# Patient Record
Sex: Male | Born: 2000 | Race: White | Hispanic: No | Marital: Single | Smoking: Never smoker
Health system: Southern US, Community
[De-identification: ages and names within clinical notes are randomized; demographics above are authoritative.]

---

## 2019-03-26 ENCOUNTER — Other Ambulatory Visit: Payer: Self-pay

## 2019-03-26 DIAGNOSIS — Z20822 Contact with and (suspected) exposure to covid-19: Secondary | ICD-10-CM

## 2019-03-27 LAB — NOVEL CORONAVIRUS, NAA: SARS-CoV-2, NAA: NOT DETECTED

## 2019-04-01 ENCOUNTER — Other Ambulatory Visit: Payer: Self-pay

## 2019-04-01 DIAGNOSIS — Z20822 Contact with and (suspected) exposure to covid-19: Secondary | ICD-10-CM

## 2019-04-02 LAB — NOVEL CORONAVIRUS, NAA: SARS-CoV-2, NAA: NOT DETECTED

## 2019-05-12 ENCOUNTER — Other Ambulatory Visit: Payer: Self-pay

## 2019-05-12 DIAGNOSIS — Z20822 Contact with and (suspected) exposure to covid-19: Secondary | ICD-10-CM

## 2019-05-12 DIAGNOSIS — Z20828 Contact with and (suspected) exposure to other viral communicable diseases: Secondary | ICD-10-CM

## 2019-05-13 LAB — NOVEL CORONAVIRUS, NAA: SARS-CoV-2, NAA: NOT DETECTED

## 2019-05-22 ENCOUNTER — Emergency Department: Payer: 59

## 2019-05-22 ENCOUNTER — Encounter: Payer: Self-pay | Admitting: Emergency Medicine

## 2019-05-22 ENCOUNTER — Other Ambulatory Visit: Payer: Self-pay

## 2019-05-22 ENCOUNTER — Emergency Department
Admission: EM | Admit: 2019-05-22 | Discharge: 2019-05-22 | Disposition: A | Discharge status: Home / Self Care | Payer: 59 | Attending: Emergency Medicine | Admitting: Emergency Medicine

## 2019-05-22 DIAGNOSIS — I861 Scrotal varices: Secondary | ICD-10-CM | POA: Insufficient documentation

## 2019-05-22 DIAGNOSIS — R52 Pain, unspecified: Secondary | ICD-10-CM

## 2019-05-22 DIAGNOSIS — N50812 Left testicular pain: Secondary | ICD-10-CM | POA: Diagnosis present

## 2019-05-22 NOTE — ED Triage Notes (Signed)
Pt arrives with complaints of left testicle pain from "bump." Pt reports "you can't really see if but you can feel it." Pt reports he first noticed it 3 weeks prior. Pt denies increased in size but reports he contacted his PCP who suggested pt come in for [place]. Pt reports "less than mild pain more of a discomfort."

## 2019-05-22 NOTE — ED Provider Notes (Signed)
[facility] Emergency Department Provider Note  ____________________________________________  Time seen: Approximately 3:45 PM  I have reviewed the triage vital signs and the nursing notes.   HISTORY  Chief Complaint Testicle Pain   HPI Shawn Beard is a 18 y.o. male no significant past medical history who presents for evaluation of left testicular mass.  Patient reports that he noticed it 3 weeks ago.  Has not changed size.  Denies any pain or trauma.  Denies any family history of testicular cancer. He noticed a small palpable "bump" in the superior  aspect of the L testicle. No abdominal pain, no fever, no weight loss, no penile discharge.  PMH None - reviewed  Allergies Patient has no allergy information on record.  FH No history of testicular cancer  Social History Smoking - no Drugs - no Alcohol - no  Review of Systems  Constitutional: Negative for fever. Eyes: Negative for visual changes. ENT: Negative for sore throat. Neck: No neck pain  Cardiovascular: Negative for chest pain. Respiratory: Negative for shortness of breath. Gastrointestinal: Negative for abdominal pain, vomiting or diarrhea. Genitourinary: Negative for dysuria. + L testicular mass Musculoskeletal: Negative for back pain. Skin: Negative for rash. Neurological: Negative for headaches, weakness or numbness. Psych: No SI or HI  ____________________________________________   PHYSICAL EXAM:  VITAL SIGNS: ED Triage Vitals [05/22/19 1350]  Enc Vitals Group     BP (!) 154/75     Pulse Rate 76     Resp 17     Temp 98.2 F (36.8 C)     Temp src      SpO2 98 %     Weight 175 lb (79.4 kg)     Height 6\' 3"  (1.905 m)     Head Circumference      Peak Flow      Pain Score 4     Pain Loc      Pain Edu?      Excl. in [place]?     Constitutional: Alert and oriented. Well appearing and in no apparent distress. HEENT:      Head: Normocephalic and atraumatic.         Eyes: Conjunctivae are normal. Sclera is non-icteric.       Mouth/Throat: Mucous membranes are moist.       Neck: Supple with no signs of meningismus. Cardiovascular: Regular rate and rhythm.  Respiratory: Normal respiratory effort.  Gastrointestinal: Soft, non tender, and non distended with positive bowel sounds. No rebound or guarding. GU: Bilateral testicles are descended with no tenderness to palpation, bilateral positive cremasteric reflexes are present, no swelling or erythema of the scrotum. No masses palpable. The area of concern seems to be the epididymis.  No evidence of inguinal hernia. Musculoskeletal: Nontender with normal range of motion in all extremities. No edema, cyanosis, or erythema of extremities. Neurologic: Normal speech and language. Face is symmetric. Moving all extremities. No gross focal neurologic deficits are appreciated. Skin: Skin is warm, dry and intact. No rash noted. Psychiatric: Mood and affect are normal. Speech and behavior are normal.  ____________________________________________   LABS (all labs ordered are listed, but only abnormal results are displayed)  Labs Reviewed - No data to display ____________________________________________  EKG  none  ____________________________________________  RADIOLOGY  I have personally reviewed the images performed during this visit and I agree with the Radiologist's read.   Interpretation by Radiologist:  US Scrotum W/doppler  Result Date: 05/22/2019 CLINICAL DATA:  Left testicular area of  palpable concern and pain. EXAM: SCROTAL ULTRASOUND DOPPLER ULTRASOUND OF THE TESTICLES TECHNIQUE: Complete ultrasound examination of the testicles, epididymis, and other scrotal structures was performed. Color and spectral Doppler ultrasound were also utilized to evaluate blood flow to the testicles. COMPARISON:  None. FINDINGS: Right testicle Measurements: 5.0 x 2.8 x 2.4 cm. No mass or microlithiasis visualized. Left  testicle Measurements: 0.5 x 3.1 x 2.5 cm. No mass or microlithiasis visualized. Right epididymis:  Normal in size and appearance. Left epididymis:  Normal in size and appearance. Hydrocele:  None visualized. Varicocele:  Mild left varicocele. Pulsed Doppler interrogation of both testes demonstrates normal low resistance arterial and venous waveforms bilaterally. IMPRESSION: Normal appearance of the testicles. Mild left varicocele. Electronically Signed   By: Ted Mcalpine M.D.   On: 05/22/2019 14:44     ____________________________________________   PROCEDURES  Procedure(s) performed: None Procedures Critical Care performed:  None ____________________________________________   INITIAL IMPRESSION / ASSESSMENT AND PLAN / ED COURSE  18 y.o. male no significant past medical history who presents for evaluation of left testicular mass.  On exam he points to the superior part of his left testicle where the epididymis lies.  I do not appreciate a mass on palpation just more pronounced epididymis.  Ultrasound was done which shows a mild left varicocele but no mass or torsion.  Patient requested a referral to urology for a second opinion which was provided to him.  Discussed my standard return precautions and follow-up with PCP.       As part of my medical decision making, I reviewed the following data within the electronic MEDICAL RECORD NUMBER Nursing notes reviewed and incorporated, Old chart reviewed, Radiograph reviewed , Notes from prior ED visits and [place] Controlled Substance Database   Patient was evaluated in Emergency Department today for the symptoms described in the history of present illness. Patient was evaluated in the context of the global COVID-19 pandemic, which necessitated consideration that the patient might be at risk for infection with the SARS-CoV-2 virus that causes COVID-19. Institutional protocols and algorithms that pertain to the evaluation of patients at risk for COVID-19  are in a state of rapid change based on information released by regulatory bodies including the CDC and federal and state organizations. These policies and algorithms were followed during the patient's care in the ED.   ____________________________________________   FINAL CLINICAL IMPRESSION(S) / ED DIAGNOSES   Final diagnoses:  Varicocele      NEW MEDICATIONS STARTED DURING THIS VISIT:  ED Discharge Orders    None       Note:  This document was prepared using Dragon voice recognition software and may include unintentional dictation errors.    Don Perking, [place], MD 05/22/19 [phone]

## 2019-05-22 NOTE — ED Notes (Signed)
This RN present with MD during scrotum exam. No redness or swelling noted.

## 2019-05-22 NOTE — ED Notes (Signed)
Pt c/o bump and discomfort "when I think about it" in scrotum. Will wait for MD for assessment.

## 2019-06-09 ENCOUNTER — Other Ambulatory Visit: Payer: Self-pay | Admitting: *Deleted

## 2019-06-09 DIAGNOSIS — Z20822 Contact with and (suspected) exposure to covid-19: Secondary | ICD-10-CM

## 2019-06-10 LAB — NOVEL CORONAVIRUS, NAA: SARS-CoV-2, NAA: NOT DETECTED

## 2019-06-26 ENCOUNTER — Other Ambulatory Visit: Payer: Self-pay

## 2019-06-26 ENCOUNTER — Emergency Department
Admission: EM | Admit: 2019-06-26 | Discharge: 2019-06-26 | Disposition: A | Discharge status: Home / Self Care | Payer: 59 | Attending: Emergency Medicine | Admitting: Emergency Medicine

## 2019-06-26 ENCOUNTER — Emergency Department: Payer: 59

## 2019-06-26 DIAGNOSIS — R5383 Other fatigue: Secondary | ICD-10-CM | POA: Diagnosis not present

## 2019-06-26 DIAGNOSIS — Z20828 Contact with and (suspected) exposure to other viral communicable diseases: Secondary | ICD-10-CM | POA: Diagnosis not present

## 2019-06-26 DIAGNOSIS — Z20822 Contact with and (suspected) exposure to covid-19: Secondary | ICD-10-CM

## 2019-06-26 LAB — CBC WITH DIFFERENTIAL/PLATELET
Abs Immature Granulocytes: 0.01 10*3/uL (ref 0.00–0.07)
Basophils Absolute: 0 10*3/uL (ref 0.0–0.1)
Basophils Relative: 1 %
Eosinophils Absolute: 0.1 10*3/uL (ref 0.0–0.5)
Eosinophils Relative: 1 %
HCT: 40.6 % (ref 39.0–52.0)
Hemoglobin: 14.2 g/dL (ref 13.0–17.0)
Immature Granulocytes: 0 %
Lymphocytes Relative: 30 %
Lymphs Abs: 1.8 10*3/uL (ref 0.7–4.0)
MCH: 29.6 pg (ref 26.0–34.0)
MCHC: 35 g/dL (ref 30.0–36.0)
MCV: 84.8 fL (ref 80.0–100.0)
Monocytes Absolute: 0.6 10*3/uL (ref 0.1–1.0)
Monocytes Relative: 10 %
Neutro Abs: 3.5 10*3/uL (ref 1.7–7.7)
Neutrophils Relative %: 58 %
Platelets: 183 10*3/uL (ref 150–400)
RBC: 4.79 MIL/uL (ref 4.22–5.81)
RDW: 11.8 % (ref 11.5–15.5)
WBC: 6 10*3/uL (ref 4.0–10.5)
nRBC: 0 % (ref 0.0–0.2)

## 2019-06-26 LAB — BASIC METABOLIC PANEL
Anion gap: 11 (ref 5–15)
BUN: 18 mg/dL (ref 6–20)
CO2: 25 mmol/L (ref 22–32)
Calcium: 9.7 mg/dL (ref 8.9–10.3)
Chloride: 103 mmol/L (ref 98–111)
Creatinine, Ser: 0.79 mg/dL (ref 0.61–1.24)
GFR calc Af Amer: >60 mL/min (ref >60)
GFR calc non Af Amer: >60 mL/min (ref >60)
Glucose, Bld: 88 mg/dL (ref 70–99)
Potassium: 3.8 mmol/L (ref 3.5–5.1)
Sodium: 139 mmol/L (ref 135–145)

## 2019-06-26 LAB — SARS CORONAVIRUS 2 (TAT 6-24 HRS): SARS Coronavirus 2: NEGATIVE

## 2019-06-26 LAB — MONONUCLEOSIS SCREEN: Mono Screen: NEGATIVE

## 2019-06-26 MED ORDER — AZITHROMYCIN 250 MG PO TABS: ORAL_TABLET | ORAL | 0 refills | Status: AC

## 2019-06-26 MED ORDER — PREDNISONE 10 MG (21) PO TBPK: ORAL_TABLET | ORAL | 0 refills | Status: AC

## 2019-06-26 NOTE — ED Triage Notes (Signed)
Pt states he was under quaratine for covid from the 11/7-11/17 and was getting better but today having fatigue and just feeling tired.

## 2019-06-26 NOTE — ED Notes (Signed)
See triage note- pt tested positive for covid and quarantined for 10 days, but still feels chest tightness and fatigue- primary care dr told him to get "checked out"

## 2019-06-26 NOTE — ED Provider Notes (Signed)
[facility] Emergency Department Provider Note  ____________________________________________   First MD Initiated Contact with Patient 06/26/19 1228     (approximate)  I have reviewed the triage vital signs and the nursing notes.   HISTORY  Chief Complaint Fatigue    HPI Shawn Beard is a 18 y.o. male presents emergency department with concerns of Covid and continued fatigue.  Patient states he was tested on 06/09/2019 and was negative.  However his girlfriend tested positive and they quarantined together.  Now he is complaining of more fatigue and chest tightness.  His primary care doctor told him to come to the ED.  He is concerned as he is supposed to go home to [facility].  He denies chest pain or shortness of breath at this time.    History reviewed. No pertinent past medical history.  There are no active problems to display for this patient.   History reviewed. No pertinent surgical history.  Prior to Admission medications   Medication Sig Start Date End Date Taking? Authorizing Provider  azithromycin (ZITHROMAX Z-PAK) 250 MG tablet 2 pills today then 1 pill a day for 4 days 06/26/19   Caryn Section, Linden Dolin, PA-C  predniSONE (STERAPRED UNI-PAK 21 TAB) 10 MG (21) TBPK tablet Take 6 pills on day one then decrease by 1 pill each day 06/26/19   Versie Starks, PA-C    Allergies Patient has no known allergies.  No family history on file.  Social History Social History   Tobacco Use  . Smoking status: Never Smoker  . Smokeless tobacco: Never Used  Substance Use Topics  . Alcohol use: Yes  . Drug use: Not Currently    Review of Systems  Constitutional: No fever/chills, positive fatigue Eyes: No visual changes. ENT: No sore throat. Respiratory: Positive cough Genitourinary: Negative for dysuria. Musculoskeletal: Negative for back pain. Skin: Negative for rash.    ____________________________________________   PHYSICAL EXAM:   VITAL SIGNS: ED Triage Vitals  Enc Vitals Group     BP 06/26/19 1144 140/72     Pulse Rate 06/26/19 1144 72     Resp 06/26/19 1144 16     Temp 06/26/19 1144 98.5 F (36.9 C)     Temp Source 06/26/19 1144 Oral     SpO2 06/26/19 1144 99 %     Weight 06/26/19 1146 175 lb (79.4 kg)     Height 06/26/19 1146 6' 3"  (1.905 m)     Head Circumference --      Peak Flow --      Pain Score 06/26/19 1145 0     Pain Loc --      Pain Edu? --      Excl. in [place]? --     Constitutional: Alert and oriented. Well appearing and in no acute distress. Eyes: Conjunctivae are normal.  Head: Atraumatic. Nose: No congestion/rhinnorhea. Mouth/Throat: Mucous membranes are moist.   Neck:  supple no lymphadenopathy noted Cardiovascular: Normal rate, regular rhythm. Heart sounds are normal Respiratory: Normal respiratory effort.  No retractions, lungs c t a  GU: deferred Musculoskeletal: FROM all extremities, warm and well perfused Neurologic:  Normal speech and language.  Skin:  Skin is warm, dry and intact. No rash noted. Psychiatric: Mood and affect are normal. Speech and behavior are normal.  ____________________________________________   LABS (all labs ordered are listed, but only abnormal results are displayed)  Labs Reviewed  SARS CORONAVIRUS 2 (TAT 6-24 HRS)  CBC WITH DIFFERENTIAL/PLATELET  BASIC METABOLIC  PANEL  MONONUCLEOSIS SCREEN   ____________________________________________   ____________________________________________  RADIOLOGY  Chest x-ray shows questionable pneumonia, in my opinion it shows streaking typical of Covid  ____________________________________________   PROCEDURES  Procedure(s) performed: no  Procedures    ____________________________________________   INITIAL IMPRESSION / ASSESSMENT AND PLAN / ED COURSE  Pertinent labs & imaging results that were available during my care of the patient were reviewed by me and considered in my medical decision  making (see chart for details).   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  Physical exam shows patient to appear well.  Vitals are normal.  Chest x-ray shows questionable beginnings of pneumonia. CBC, met C, mono test are all negative  Explained all the results to the patient.  He was given a prescription for Z-Pak steroid pack and encouraged to take Mucinex.  We did a Covid swab.  Explained patient the results would not be back for 6 to 24 hours.  He is to follow-up with his regular doctor at home in [place] if not improving in 3 days.  Return emergency department worsening.  Explained to him I would feel much better if he continue to quarantine for another week while at home as he may not have been positive originally when his girlfriend turned positive but he may be turning positive now.  He states he understands and will comply.  He was discharged stable condition.    Cristal Howatt was evaluated in Emergency Department on 06/26/2019 for the symptoms described in the history of present illness. He was evaluated in the context of the global COVID-19 pandemic, which necessitated consideration that the patient might be at risk for infection with the SARS-CoV-2 virus that causes COVID-19. Institutional protocols and algorithms that pertain to the evaluation of patients at risk for COVID-19 are in a state of rapid change based on information released by regulatory bodies including the CDC and federal and state organizations. These policies and algorithms were followed during the patient's care in the ED.   As part of my medical decision making, I reviewed the following data within the [place] notes reviewed and incorporated, Labs reviewed CBC, met C, mono negative, Old chart reviewed, Radiograph reviewed chest x-ray questionable pneumonia, Notes from prior ED visits and [place] Controlled Substance Database   ____________________________________________   FINAL CLINICAL IMPRESSION(S) / ED DIAGNOSES  Final diagnoses:  Suspected COVID-19 virus infection      NEW MEDICATIONS STARTED DURING THIS VISIT:  Discharge Medication List as of 06/26/2019  1:55 PM    START taking these medications   Details  azithromycin (ZITHROMAX Z-PAK) 250 MG tablet 2 pills today then 1 pill a day for 4 days, Normal    predniSONE (STERAPRED UNI-PAK 21 TAB) 10 MG (21) TBPK tablet Take 6 pills on day one then decrease by 1 pill each day, Normal         Note:  This document was prepared using Dragon voice recognition software and may include unintentional dictation errors.    Versie Starks, PA-C 06/26/19 1439    Duffy Bruce, MD 06/26/19 2256

## 2019-06-26 NOTE — Discharge Instructions (Addendum)
Follow-up with your regular doctor if not improving in 3 to 4 days.  Use medication as prescribed.  Return emergency department worsening.  Use Mucinex to break up the mucous plugs.  Covid test will result in 6 to 24 hours.  A nurse will call you with the results.  If you have not heard from [place] in 48 hours please call the ER for your results.

## 2020-11-02 IMAGING — DX DG CHEST 1V PORT
2 series · 2 of 2 positions shown · non-contrast
Comparison: None.

CLINICAL DATA: Shortness of breath, cough, suspected COVID

EXAM:
PORTABLE CHEST 1 VIEW

[chest ap (1 of 2)]
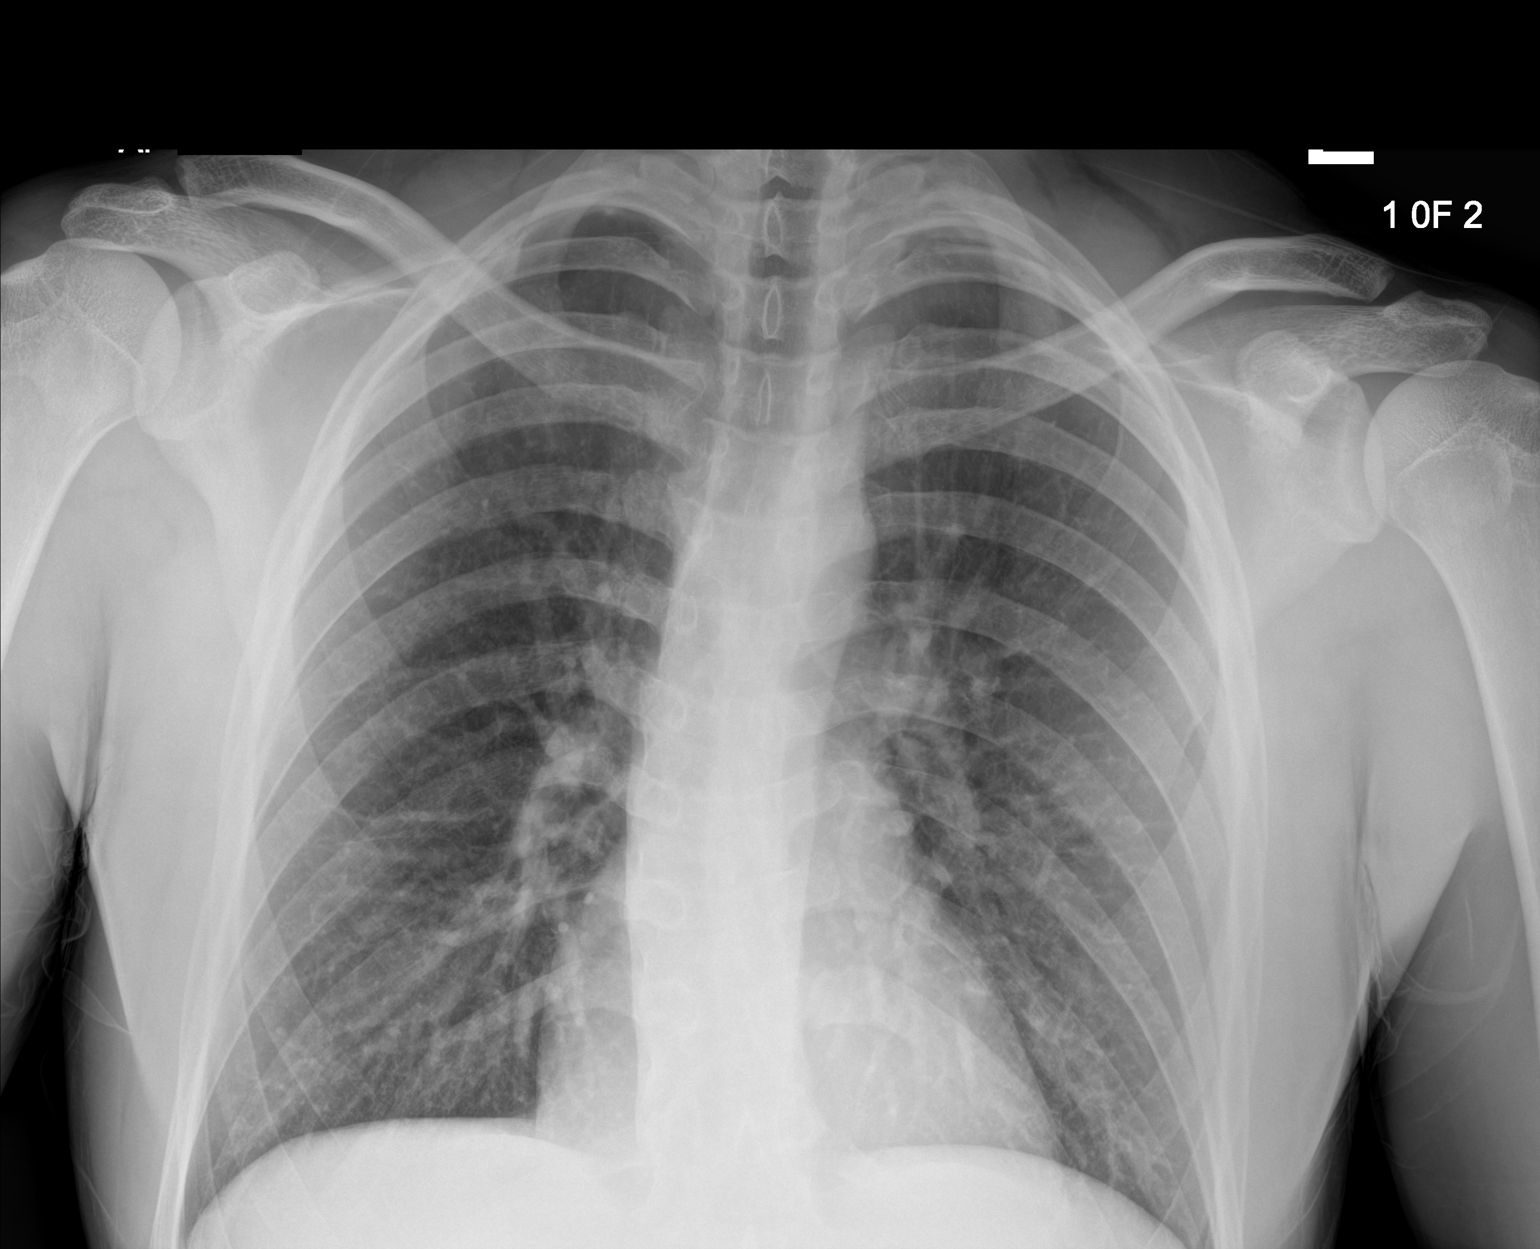

[chest ap (2 of 2)]
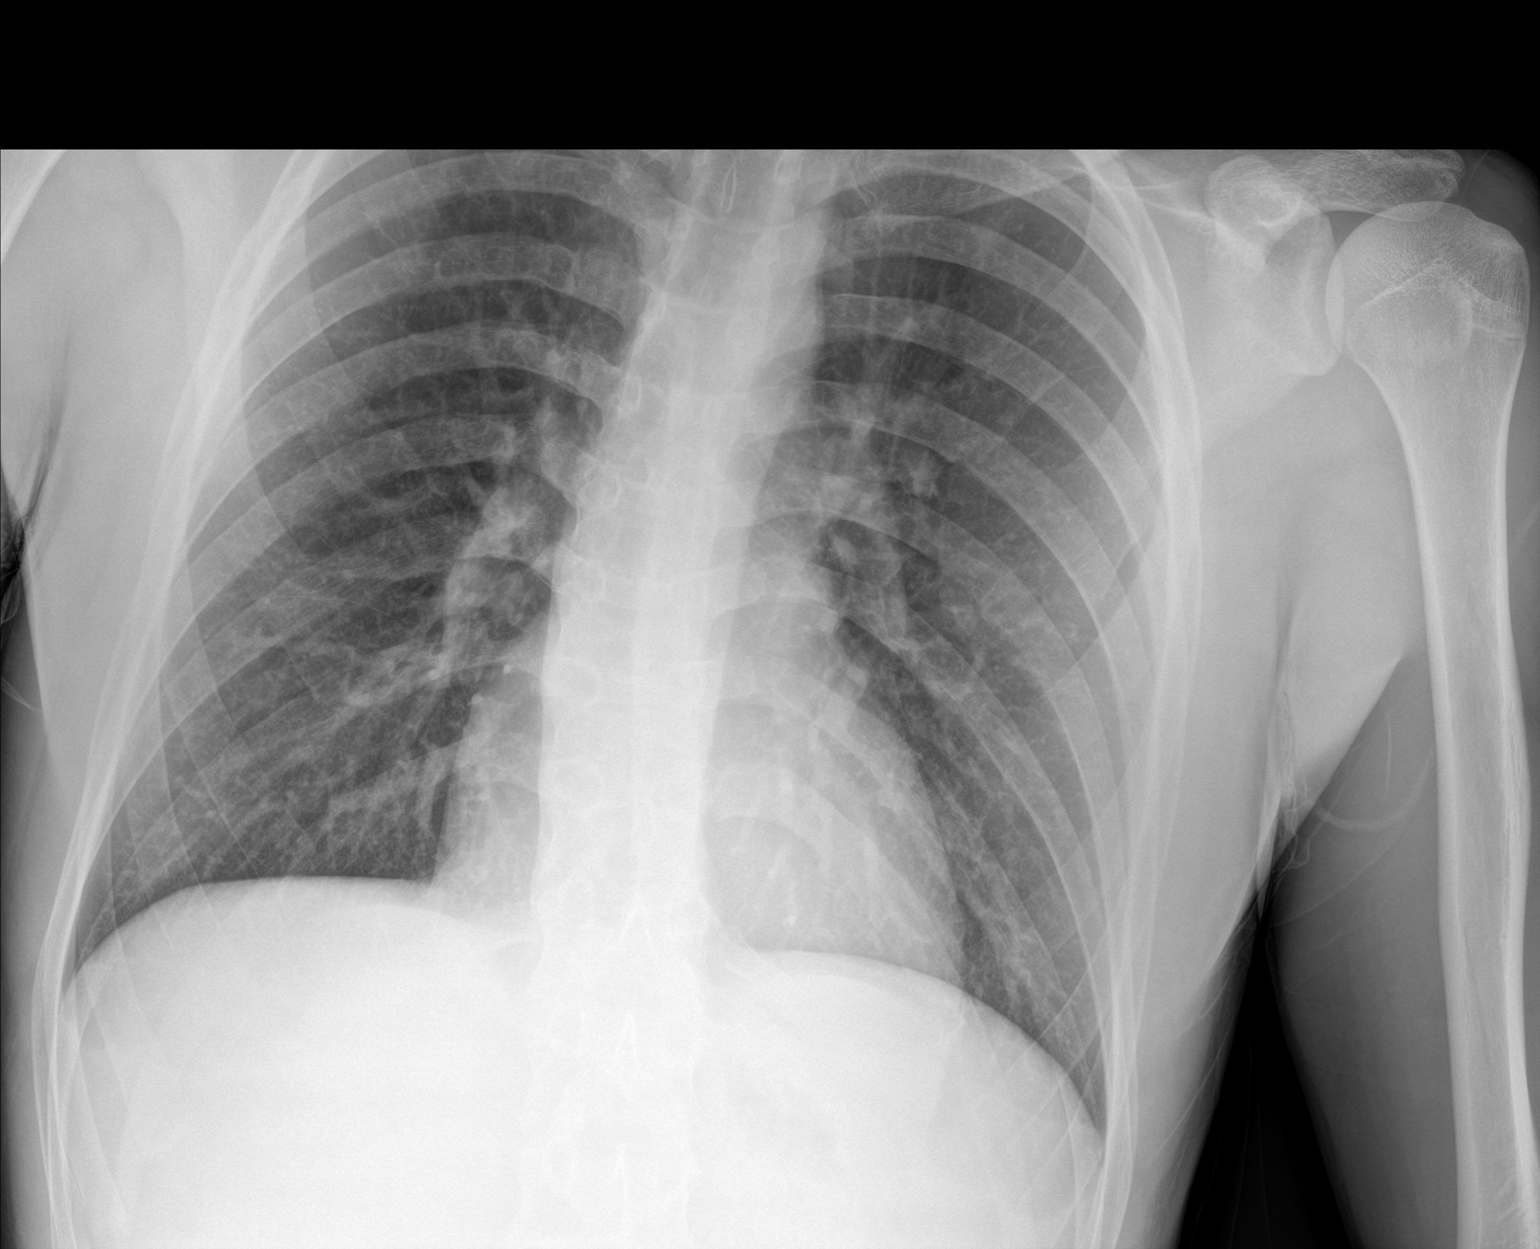

[2 of 2 positions shown; findings below may reference images not displayed]

FINDINGS: The heart size and mediastinal contours are within normal limits.
Both lungs are clear. Question minor interstitial prominence. No
pleural effusion. The visualized skeletal structures are
unremarkable.
IMPRESSION: No definite acute process in the chest. Question of minor
interstitial prominence, which could reflect early changes of
pneumonia.
